# Patient Record
Sex: Male | Born: 1990 | Race: Black or African American | Hispanic: No | Marital: Single | Smoking: Never smoker
Health system: Southern US, Community
[De-identification: ages and names within clinical notes are randomized; demographics above are authoritative.]

## PROBLEM LIST (undated history)

## (undated) DIAGNOSIS — J45909 Unspecified asthma, uncomplicated: Secondary | ICD-10-CM

## (undated) HISTORY — PX: SPINE SURGERY: SHX786

---
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
## 2009-03-05 ENCOUNTER — Ambulatory Visit: Payer: Self-pay | Admitting: Thoracic Surgery

## 2010-06-21 ENCOUNTER — Encounter: Payer: Self-pay | Admitting: Thoracic Surgery

## 2010-06-22 ENCOUNTER — Encounter: Payer: Self-pay | Admitting: Thoracic Surgery

## 2010-06-28 IMAGING — CR DG CHEST 1V PORT
1 series · 1 of 1 positions shown · non-contrast
Comparison: Chest radiographs obtained earlier today and chest CT
dated 01/28/2008.

CLINICAL DATA: Status post surgery for a right paraspinal mass.

PORTABLE CHEST - 1 VIEW

[view not recorded]
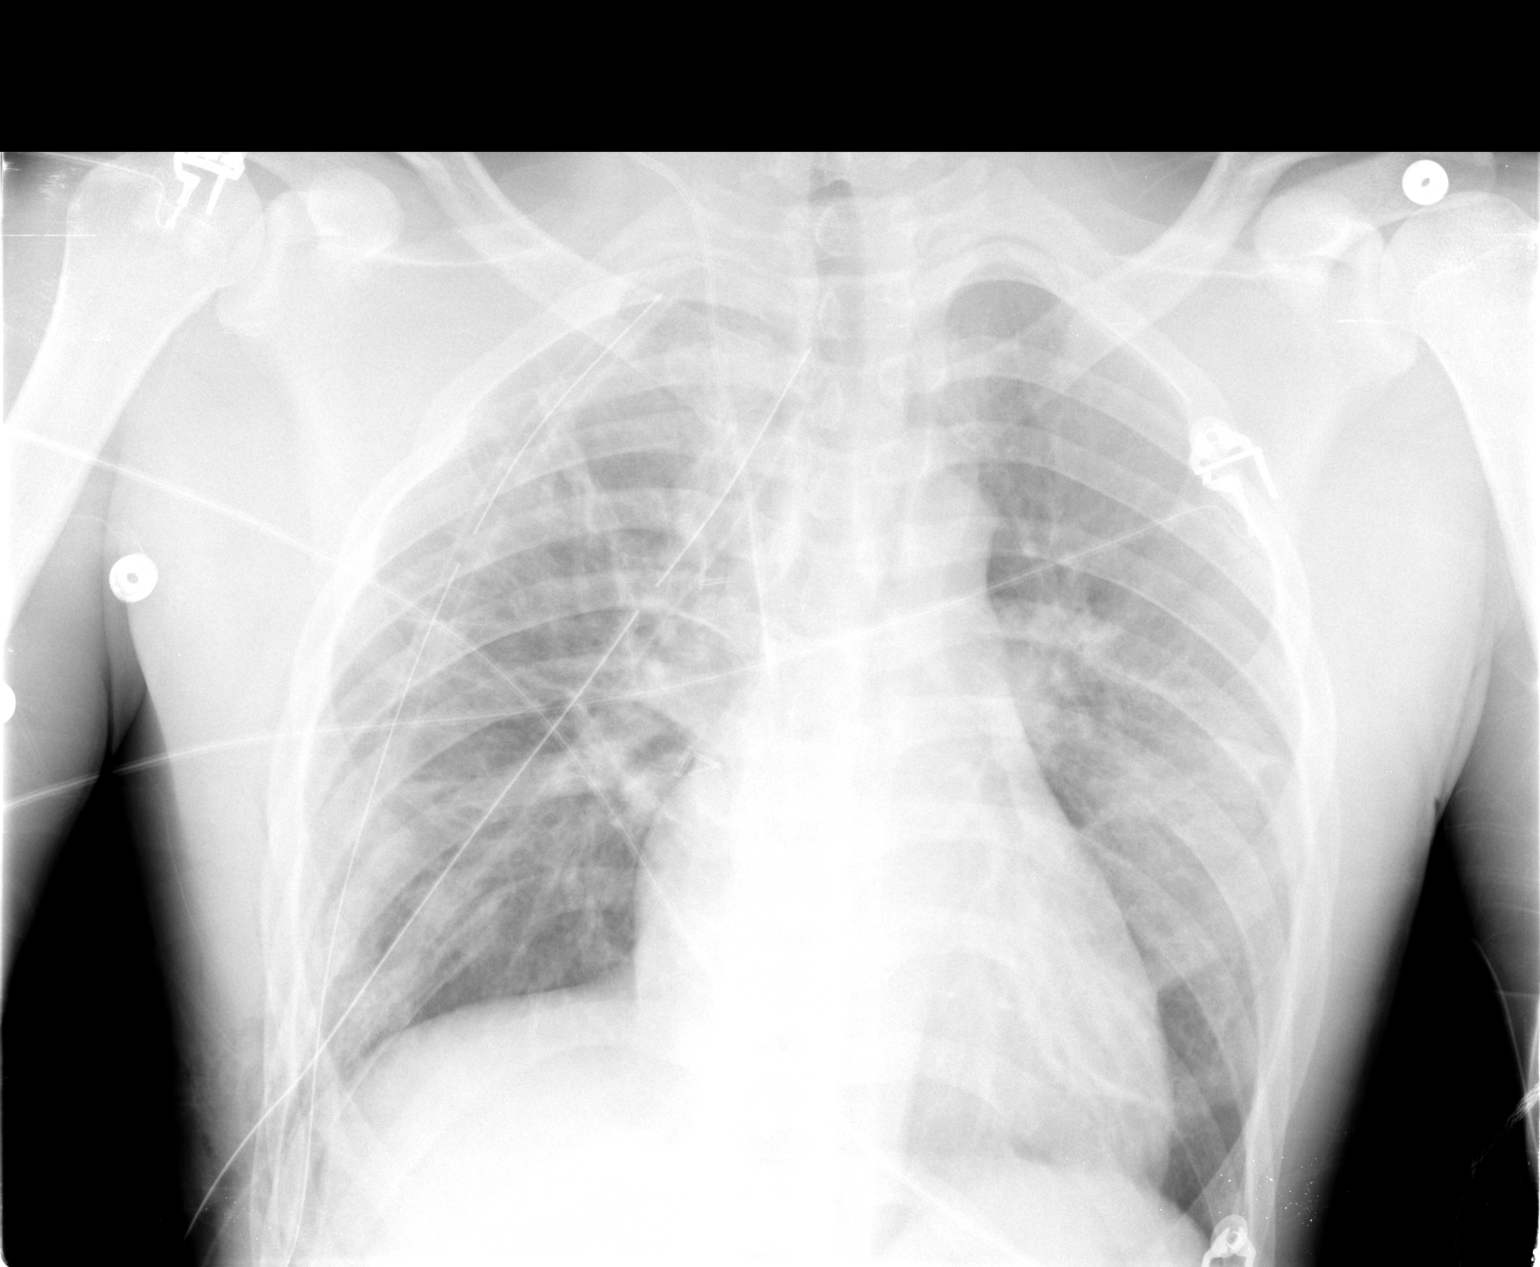

[1 of 1 positions shown; findings below may reference images not displayed]

FINDINGS: The right paraspinal mass has been resected with
associated surgical clips.  Interval right jugular catheter with
its tip in the superior vena cava.  Two right chest tubes are in
place.  No pneumothorax is seen.  The heart remains grossly normal
in size.  Mild bilateral atelectasis.  Small amount of subcutaneous
emphysema on the right.
IMPRESSION: 1.  Postsurgical changes on the right without pneumothorax.
2.  Mild bilateral atelectasis.

## 2010-06-29 IMAGING — CR DG CHEST 1V PORT
1 series · 1 of 1 positions shown · non-contrast
Comparison: 04/29/2008

CLINICAL DATA: Postop.

PORTABLE CHEST - 1 VIEW , portable at 5997 hours:

[view not recorded]
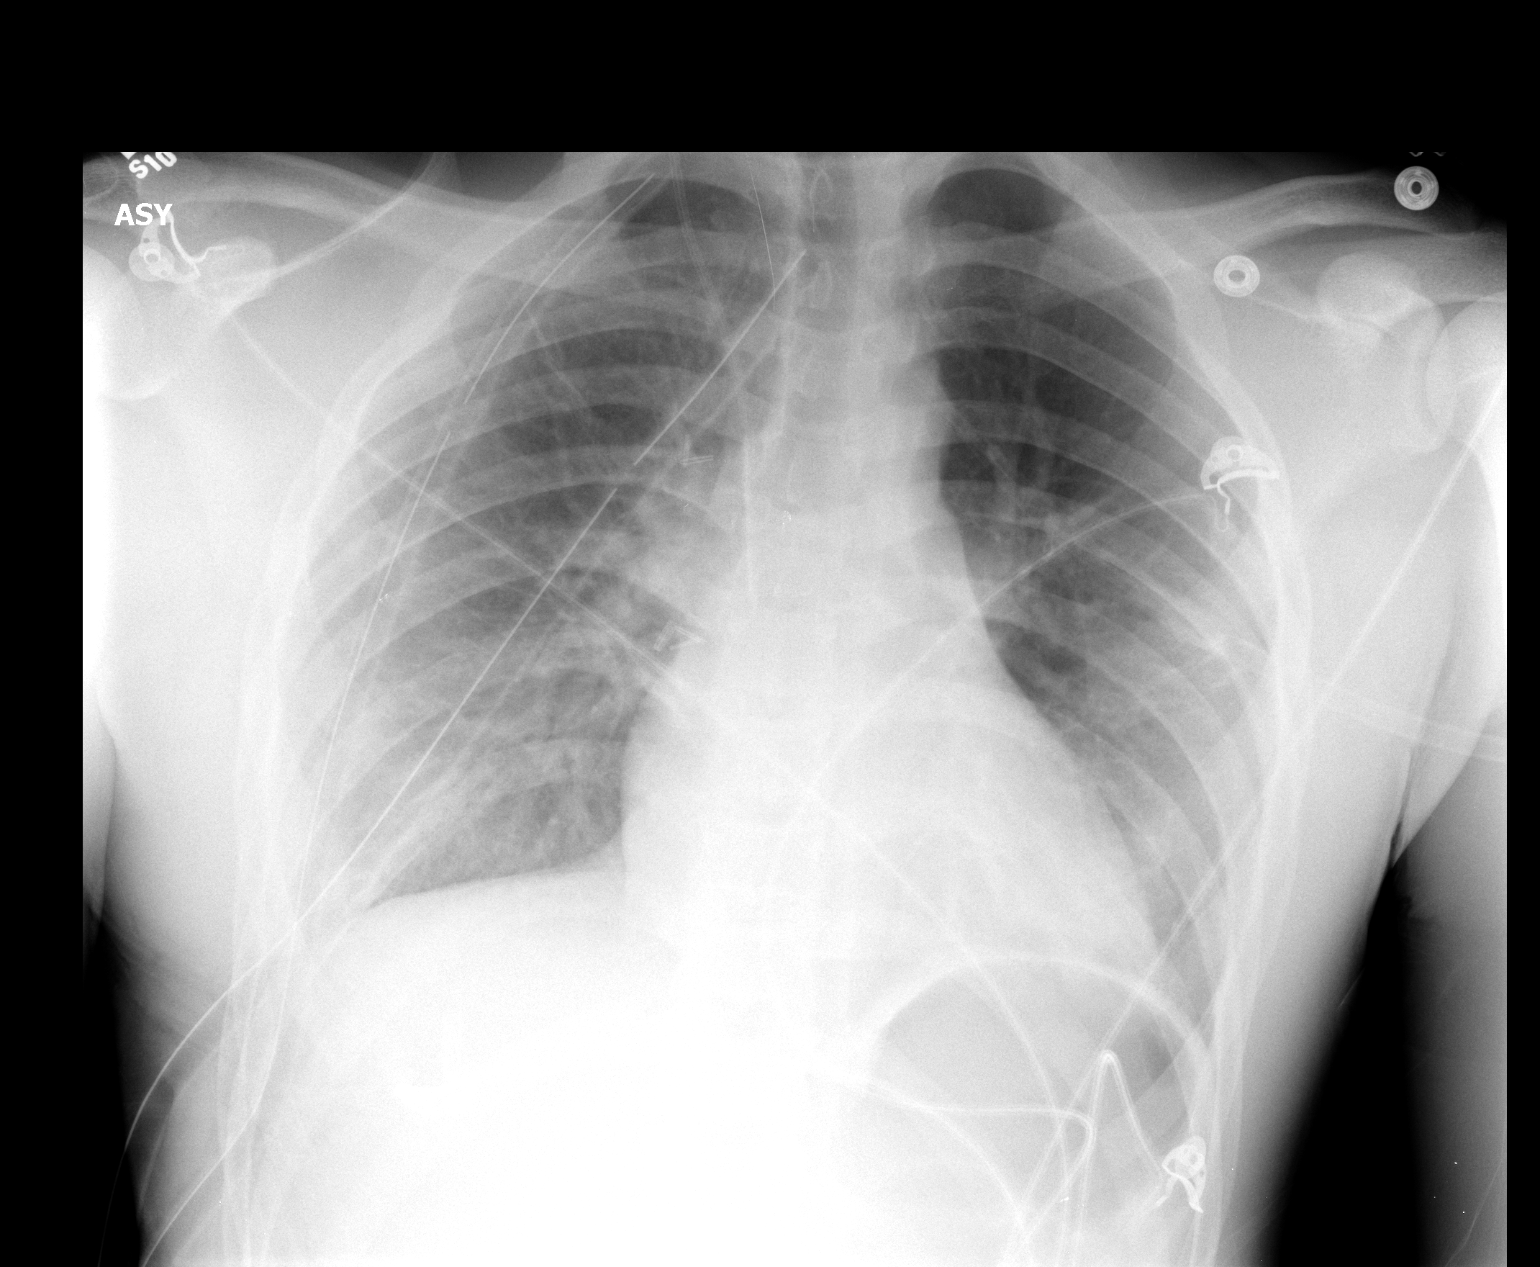

[1 of 1 positions shown; findings below may reference images not displayed]

FINDINGS: Two right pleural chest tubes in position with tips at
the apex of the right hemithorax.  Right jugular CVC is in the
lower SVC.  Lungs well expanded and clear of an active process. No
pneumothorax
IMPRESSION: No acute chest findings.

## 2010-10-13 NOTE — H&P (Signed)
NAME:  SAJAN, CHEATWOOD NO.:  [id number]   MEDICAL RECORD NO.:  [id number]          PATIENT TYPE:  INP   LOCATION:  NA                           FACILITY:  [facility]   PHYSICIAN:  Ines Bloomer, M.D. DATE OF BIRTH:  May 14, 1991   DATE OF ADMISSION:  DATE OF DISCHARGE:                              HISTORY & PHYSICAL   CHIEF COMPLAINT:  Right lung mass.   HISTORY OF PRESENT ILLNESS:  This 20 year old African American male has  a history of abscess and went to the emergency room where a chest x-ray  showed an abnormal posterior mediastinal mass.  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  At T6, it does enter the  intervertebral foramen but causes no cord compression.  An MRI was done  which essentially showed the same things as CT scan.  He has had no  symptoms.  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  He has no fevers, weight loss, chills, or hemoptysis.   PAST MEDICAL HISTORY:  Normal childhood illnesses.   MEDICATIONS:  Albuterol inhaler, Flonase, and Clarinex-D.   ALLERGIES:  He has no medication allergies.   FAMILY HISTORY:  Noncontributory.   SOCIAL HISTORY:  He is senior at [employer], runs track.  No admitted  alcohol or tobacco abuse.   REVIEW OF SYSTEMS:  VITAL SIGNS:  He is 5 feet 8 inches.  CARDIAC:  No  angina or atrial fibrillation.  PULMONARY:  No asthma.  No hemoptysis.  GU:  No kidney disease.  GI:  No nausea, vomiting, constipation, or  diarrhea.  VASCULAR:  No claudication, DVT, or TIAs.  NEUROLOGIC:  No  dizziness, headaches, blackouts, or seizures.  No symptoms of cord  compression.  PSYCHIATRIC:  No depression or nervousness.  ENT:  No  changes in eyesight or hearing.  HEMATOLOGICAL:  No problems with  bleeding, clotting disorders or pain.   PHYSICAL EXAMINATION:  GENERAL:  He is a well-developed [place] American  male in no acute distress.  VITAL SIGNS:  His blood pressure is 134/60, pulse 56, respirations 18,  and sats were 96%.  HEAD:  Atraumatic.  EYES:  Pupils equal and reactive to light and accommodation.  Extraocular movements are normal.  EARS:  Tympanic membranes are intact.  NOSE:  There is no septal deviation.  THROAT:  Without lesion.  NECK:  Supple without thyromegaly.  There is no supraclavicular or  axillary adenopathy.  HEART:  Regular sinus rhythm, no murmurs.  ABDOMEN:  Soft.  There is no hepatosplenomegaly.  EXTREMITIES:  Pulses are 2+.  There is no clubbing or edema.  NEUROLOGICAL:  He is oriented x3.  Sensory and motor intact.  Cranial  nerves intact.  SKIN:  Without lesion.   IMPRESSION:  1. Right T5 through T7 posterior mediastinal mass, rule out      paraganglioma, rule out schwannoma.  2. Asthma.   PLAN:  With Dr. Donalee Citrin, right VATS, right thoracotomy,  and resection  of posterior mediastinal mass.      Ines Bloomer, M.D.  Electronically Signed     DPB/MEDQ  D:  04/23/2008  T:  04/24/2008  Job:  [id number]

## 2010-10-13 NOTE — Op Note (Signed)
NAMEMarland Kitchen  AUBRY, TUCHOLSKI NO.:  [id number]   MEDICAL RECORD NO.:  [id number]          PATIENT TYPE:  INP   LOCATION:  3113                         FACILITY:  [facility]   PHYSICIAN:  Donalee Citrin, M.D.        DATE OF BIRTH:  [birth year]   DATE OF PROCEDURE:  DATE OF DISCHARGE:                               OPERATIVE REPORT   PREOPERATIVE DIAGNOSIS:  Right paraspinal mass.   PROCEDURE:  Right paraspinal mass.   PROCEDURE:  Transthoracic removal of right paraspinal tumor at T6.   SURGEON:  Donalee Citrin, MD   CO-SURGEON:  Ines Bloomer, MD   ANESTHESIA:  General endotracheal.   HISTORY OF PRESENT ILLNESS:  The patient is an 20 year old gentleman who  was noted to have a right paraspinal mass picked up initially on chest x-  ray and subsequent chest CT and MRI scan consistent with either  schwannoma or paraganglioma.  This mass is measuring about 3 x 6 cm and  was located centering around the T6 vetebral body.  The patient is  recommended transthoracic approach to the mass.  The first part of the  operative note, both the exposure and closure and initiation of  resection tumor will be dictated by Dr. Ines Bloomer.  This is the  intrathoracic resection of the mass.   OPERATIVE PROCEDURE:  After the thoracotomy performed by Dr. Edwyna Shell and  the mass was identified, the lateral medial margins of the mass were  identified along the rib using a careful blunt dissection after the  lateral pleura had been divided and a capsule of mass was identified.  Using blunt dissection, using Kittners and then Metzenbaum scissors, we  developed a plane working both laterally above the inferior rib  developing the plane working medially along the lateral vertebral body.  The plane around the lateral vertebral body was then also developed.  The mass was very well encapsulated and sequentially divided and removed  from the lateral vertebral body as well as superior aspect of the rib as  there was what appeared to be 2 possible points where there were tails  that extended into the intervertebral foramen.  The inferior vertebral  plane was easily developed on the predominant what was probably the T6  vertebral foramen.  The mass did have a small tail that tracked along  the nerve root, so attention was taken to try to develop the plane  around this mass and identified this intercostal and the intercostal  nerve was then identified and divided as the mass was originating from  around that nerve and was necessitated for removal of the mass, but  working sequentially inferomedially as well as superolaterally planes  were developed and working around the capsule being careful not enter  into the tumor itself.  This was developed until the only piece of the  pedicle attachment was into the T6 vertebral foramen.  After this nerve  was divided and the tumor was removed from the chest cavity, the foramen  was explored and there was no overt residual tumor remaining although it  is possible that some could retract along the foramen into the canal;  however, preoperative CT and MRI did not confirm any intraforaminal or  intracanalicular extension of the tumor mass.  Careful inspection and  careful  hemostasis was then obtained.  No additional tumor mass was appreciated.  The wound was then turned over to Dr. Edwyna Shell for closure, which will be  dictated in a separate operative note.  At the end of the case, all  needle count and sponge count correct.           ______________________________  Donalee Citrin, M.D.     GC/MEDQ  D:  04/29/2008  T:  04/29/2008  Job:  [id number]

## 2010-10-13 NOTE — Letter (Signed)
May 08, 2008   Donalee Citrin, MD  [address]  [place], [place] [address]   Re:  LATHYN, GRIGGS                  DOB:  Sep 30, 1990   Dear Jillyn Hidden:   I saw the patient back in the office today, and he is doing remarkably  well after his thoracotomy for resection of the ganglioneuroma.  His  chest x-ray showed postoperative changes.  You can see clips where the  neuroma was.  His lungs were clear to auscultation and percussion.  We  removed his chest tube sutures and told him to gradually increase his  activities.  I will see him back again in 2 weeks and hope we can  release him to full activity at that time.   Ines Bloomer, M.D.  Electronically Signed   DPB/MEDQ  D:  05/08/2008  T:  05/09/2008  Job:  [id number]

## 2010-10-13 NOTE — Assessment & Plan Note (Signed)
OFFICE VISIT   John Holloway, John Holloway  DOB:  02-01-1991                                        May 21, 2008  CHART #:  [id number]   The patient comes back in the office today for a 2-week followup.  He is  doing well status post a right thoracotomy for resection of  ganglioneuroma.  He is still having some soreness, but is overall doing  very well.  He has no specific complaints today other than just some  soreness and some mild numbness around the incision site.   PHYSICAL EXAMINATION:  VITAL SIGNS:  Blood pressure is 122/68, pulse is  72, respirations 18, and O2 98% on room air.  CHEST:  His right thoracotomy incision and chest tube sites have healed  well.  HEART:  Regular rate and rhythm without murmurs, rubs, or gallops.  LUNGS:  Clear to auscultation.   Chest x-ray shows stable postoperative changes and no pneumothorax.   ASSESSMENT AND PLAN:  The patient is doing well postoperatively.  Dr.  Edwyna Shell saw him today and reviewed his chest x-ray.  We will plan on  allowing him to return to school on June 03, 2008, and back to track  practice on June 14, 2008, and Dr. Edwyna Shell has written him a note to  this effect.  He will get in to see Dr. Wynetta Emery in the next 1-2 weeks.  We  will plan on seeing him back here again in 6 weeks with a chest x-ray  for follow up.   Ines Bloomer, M.D.  Electronically Signed   GC/MEDQ  D:  05/21/2008  T:  05/21/2008  Job:  [id number]   cc:   Donalee Citrin, M.D.

## 2010-10-13 NOTE — Op Note (Signed)
NAMEMarland Holloway  John, Holloway NO.:  [id number]   MEDICAL RECORD NO.:  [id number]          PATIENT TYPE:  INP   LOCATION:  3113                         FACILITY:  [facility]   PHYSICIAN:  Ines Bloomer, M.D. DATE OF BIRTH:  10/09/1990   DATE OF PROCEDURE:  04/29/2008  DATE OF DISCHARGE:                               OPERATIVE REPORT   PREOPERATIVE DIAGNOSIS:  Right paraspinal mass.   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   OPERATION PERFORMED:  Right thoracotomy resection of T5-T6 neurogenic  tumor.   SURGEON:  Ines Bloomer, MD   CO-SURGEON:  Donalee Citrin, MD   FIRST ASSISTANT:  Coral Ceo, PA-C   After percutaneous insertion of all monitor lines, the patient underwent  general anesthesia, was turned to the right lateral thoracotomy  position, dual-lumen tube was inserted.  The right lung was deflated.  This patient had a large 4 x 6 tumor over T5 and T6.  This was felt to  be possibly a paraganglioma or neurogenic tumor.  The right lung was  deflated, and two trocar sites were made in the anterior and posterior  axillary line at the seventh intercostal space.  Two trocars were  inserted at 0 degree.  A third-degree scope was inserted, and the lesion  was easily seen at T5-C6.  The pictures were taken of this.  Then, a  right posterolateral thoracotomy was made.  It was carried down with  electrocautery through the subcutaneous tissue and fascia.  Pretracheal  fascia was entered, and it was carried down to the subcutaneous tissue.  The latissimus was partially divided.  The serratus was reflected  anteriorly.  We first entered the sixth intercostal space, and I had  realized we would also have to go in the fifth intercostal space in  order to get a complete resection of the upper portion.  The dissection  was started.  __________  retractor was placed in the intercostal space.  Dissection was started.  Inferiorly dissecting it off the portion of  the  sixth and seventh rib and then dissected with electrocautery and then  dissecting up to the sixth rib and then finally the fifth rib, we were  able to dissect it out of the sixth intercostal space without too much  trouble, and Dr. Wynetta Emery will dictate this in more detail separately.  Superiorly, we divided the pleura over the spinal cord and reflected  medially to laterally, and then, superiorly we divided the pleura,  dissected from superiorly to inferiorly.  The largest problem was at the  fifth intercostal space, which we dissected off the fourth and fifth and  sixth rib dissecting down to the intervertebral foramen and then  dividing the tumor out of the intervertebral foramen.  Bleeding was  electrocoagulated.  The tumor was removed and mass and sent for frozen  section which revealed a neurogenic tumor.  All bleeding was  electrocoagulated.  Two chest tubes were brought into the trocar sites  and tied in place with 0 silk.  Chest was closed with six pericostals  drilling through the fifth  and the sixth ribs and passed around the  fourth rib and #1 Vicryl in the  muscle layer, 2-0 Vicryl in the subcutaneous tissue, and Dermabond for  the skin.  Single On-Q was done in the usual fashion.  Marcaine block  was done in the usual fashion.  The patient was returned to recovery  room in stable condition.      Ines Bloomer, M.D.  Electronically Signed     DPB/MEDQ  D:  04/29/2008  T:  04/29/2008  Job:  [id number]   cc:   Donalee Citrin, M.D.

## 2010-10-13 NOTE — Discharge Summary (Signed)
NAME:  John Holloway, John Holloway NO.:  [id number]   MEDICAL RECORD NO.:  [id number]          PATIENT TYPE:  INP   LOCATION:  2008                         FACILITY:  [facility]   PHYSICIAN:  Donalee Citrin, M.D.        DATE OF BIRTH:  [birth year]   DATE OF ADMISSION:  04/29/2008  DATE OF DISCHARGE:  05/03/2008                               DISCHARGE SUMMARY   ADMITTING DIAGNOSIS:  Paraganglioma, T6.   HISTORY OF PRESENT ILLNESS:  The patient is an 20 year old gentleman who  was admitted to the hospital, underwent a transthoracic resection of the  paraganglioma at T6.  Postoperatively, the patient did very well right  through the ICU with chest tube placed, was neurologically intact,  stable, progressing, mobilized once the tube was able to be taken out  and pain came under very good control.  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  The patient was doing very well, stable  with no postoperative __________ chest tube.  The patient was discharged  home on postop day #5.  After discharge, the patient was voiding  spontaneously, tolerating p.o. pain medications __________.           ______________________________  Donalee Citrin, M.D.     GC/MEDQ  D:  06/26/2008  T:  06/27/2008  Job:  [id number]

## 2010-10-13 NOTE — Letter (Signed)
March 12, 2008   Donalee Citrin, MD  [address], [address]  [place], [place] [address]   Re:  John Holloway, BATTISTE                  DOB:  03-08-1991   Dear Jillyn Hidden,   I saw the patient today.  This patient has a history of asthma and went  to the emergency room, got a chest x-ray that showed an abnormal  posterior mediastinal mass.  A CT scan done and the CT scan showed a T5,  6, and 7 posterior mediastinal mass, which was possibly a paraganglioma  or schwannoma.  The T6 does enter the intervertebral foramen.  MRI was  also done to confirm this.  He has had no symptoms.  He has been an  active teenager runs track and has been considered for college  scholarship.  He has had no sweating, fever, or chills.  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   PAST MEDICAL HISTORY:  He has had normal childhood diseases.   MEDICATIONS:  He is on albuterol inhaler, Flonase, Claritin-D, and  Nasonex.   ALLERGIES:  He has no allergies.   FAMILY HISTORY:  Noncontributory.   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  No alcohol or smoking abuse.   REVIEW OF SYSTEMS:  Vital Signs:  He is 5 feet 8 inches.  Cardiac:  Has  no symptoms.  Pulmonary:  Except for asthma is negative.  No problems  with GI, GU, vascular, neurological, psychiatric, ENT, or hematological  problems.   PHYSICAL EXAMINATION:  GENERAL:  He is a well-developed [place]  male, in no acute distress.  VITAL SIGNS:  Blood pressure is 134/65, pulse 56, respirations 18, and  sats were 96%.  HEAD, EYES, EARS, NOSE, AND THROAT:  Unremarkable.  NECK:  Supple without thyromegaly.  There is no supraclavicular or  axillary adenopathy.  CHEST:  Clear to auscultation and percussion.  HEART:  Regular sinus rhythm.  No murmurs.  ABDOMEN:  Soft.  There is no hepatosplenomegaly.  EXTREMITIES:  Pulses are 2+.  There is no clubbing or edema.  NEUROLOGIC:   She is oriented x3.  Sensory and motor intact.  Cranial  nerves intact.   I feel that this is a fairly large posterior mediastinal mass and I  think a paraganglioma or schwannoma would be most likely diagnosis with  this involving an intervertebral foramina,  I think, he will probably  require open thoracotomy resected also given the size.  I discussed in  great detail the complications of the possible surgery including  infection, bleeding, and possible spinal cord trauma.  I think, we  should probably proceed with this earlier rather than later.  He did  want to put this off to next June because of his track, but I am so much  concerned given the size of this that he may develop problems if we put  it off that long.  I will discuss this with you and then probably make  arrangements to proceed.   Ines Bloomer, M.D.  Electronically Signed   DPB/MEDQ  D:  03/12/2008  T:  03/13/2008  Job:  [id number]   cc:   Deatra James, M.D.

## 2010-10-13 NOTE — Assessment & Plan Note (Signed)
OFFICE VISIT   ALMUS, WOODHAM  DOB:  Dec 26, 1990                                        July 09, 2008  CHART #:  [id number]   HISTORY OF PRESENTING ILLNESS:  This is an 20 year old African American  male who is status post right thoracotomy with resection of  ganglioneuroma on 04/29/2008.  He was last seen in the office on  05/21/2008 at which time, he was complaining of some soreness and some  mild numbness around the incision site.  Currently today, however, his  soreness is completely resolved and he denies any further numbness or  shortness of breath.   PHYSICAL EXAMINATION:  GENERAL:  This is an 20 year old [place] American  male who is in no acute distress who is alert, oriented, and  cooperative.  VITAL SIGNS:  Latest vital signs reveal blood pressure  138/72, pulse rate 50, respirations 18, and O2 sat 97% on room air.  CARDIOVASCULAR:  Regular rate and rhythm.  No murmurs, gallops, or rubs.  PULMONARY:  Clear to auscultation bilaterally.  No rales, wheezes, or  rhonchi.  WOUNDS:  Right chest wound is well healed.   Chest x-ray done today shows no pneumothorax and healing left rib  fracture, otherwise no abnormalities noted.   IMPRESSION AND PLAN:  The patient is surgically stable, status post  right thoracotomy with resection of ganglioneuroma.  He has been cleared  to do all activities.  He will return to see Dr. Edwyna Shell in 6 months with  a chest x-ray and after that he should have a yearly chest x-ray for  serial monitoring.   Ines Bloomer, M.D.  Electronically Signed   DZ/MEDQ  D:  07/09/2008  T:  07/09/2008  Job:  [id number]   cc:   Donalee Citrin, M.D.

## 2011-03-02 LAB — TYPE AND SCREEN

## 2011-03-02 LAB — COMPREHENSIVE METABOLIC PANEL
AST: 28 U/L (ref 0–37)
Alkaline Phosphatase: 75 U/L (ref 39–117)
BUN: 14 mg/dL (ref 6–23)
Chloride: 106 mEq/L (ref 96–112)
Glucose, Bld: 109 mg/dL — ABNORMAL HIGH (ref 70–99)
Potassium: 4.1 mEq/L (ref 3.5–5.1)
Sodium: 137 mEq/L (ref 135–145)
Total Bilirubin: 0.8 mg/dL (ref 0.3–1.2)

## 2011-03-02 LAB — BLOOD GAS, ARTERIAL
O2 Saturation: 97.8 %
Patient temperature: 98.6
pH, Arterial: 7.423 (ref 7.350–7.450)

## 2011-03-02 LAB — URINE MICROSCOPIC-ADD ON

## 2011-03-02 LAB — ABO/RH: ABO/RH(D): O POS

## 2011-03-02 LAB — CBC
MCV: 82.1 fL (ref 78.0–100.0)
WBC: 4.4 10*3/uL (ref 4.0–10.5)

## 2011-03-02 LAB — URINALYSIS, ROUTINE W REFLEX MICROSCOPIC
Bilirubin Urine: NEGATIVE
Hgb urine dipstick: NEGATIVE
Ketones, ur: 15 mg/dL — AB
Leukocytes, UA: NEGATIVE
Nitrite: NEGATIVE
Specific Gravity, Urine: 1.038 — ABNORMAL HIGH (ref 1.005–1.030)
Urobilinogen, UA: 1 mg/dL (ref 0.0–1.0)

## 2011-03-02 LAB — PROTIME-INR
INR: 1.1 (ref 0.00–1.49)
Prothrombin Time: 14.7 seconds (ref 11.6–15.2)

## 2011-03-02 LAB — APTT: aPTT: 35 seconds (ref 24–37)

## 2011-03-05 LAB — COMPREHENSIVE METABOLIC PANEL
AST: 47 U/L — ABNORMAL HIGH (ref 0–37)
Albumin: 3.4 g/dL — ABNORMAL LOW (ref 3.5–5.2)
BUN: 5 mg/dL — ABNORMAL LOW (ref 6–23)
Calcium: 9.1 mg/dL (ref 8.4–10.5)
Creatinine, Ser: 1.06 mg/dL (ref 0.4–1.5)
GFR calc Af Amer: >60 mL/min (ref >60)
Total Bilirubin: 0.7 mg/dL (ref 0.3–1.2)
Total Protein: 6.2 g/dL (ref 6.0–8.3)

## 2011-03-05 LAB — BASIC METABOLIC PANEL
BUN: 8 mg/dL (ref 6–23)
CO2: 29 mEq/L (ref 19–32)
Chloride: 100 mEq/L (ref 96–112)
Creatinine, Ser: 1.07 mg/dL (ref 0.4–1.5)
GFR calc Af Amer: >60 mL/min (ref >60)
Potassium: 4 mEq/L (ref 3.5–5.1)

## 2011-03-05 LAB — CBC
HCT: 40.7 % (ref 39.0–52.0)
HCT: 42.7 % (ref 39.0–52.0)
MCHC: 33.2 g/dL (ref 30.0–36.0)
MCHC: 33.3 g/dL (ref 30.0–36.0)
MCV: 82.1 fL (ref 78.0–100.0)
MCV: 82.8 fL (ref 78.0–100.0)
Platelets: 246 10*3/uL (ref 150–400)
RBC: 5.2 MIL/uL (ref 4.22–5.81)
RDW: 15.1 % (ref 11.5–15.5)
WBC: 10 10*3/uL (ref 4.0–10.5)
WBC: 8.5 10*3/uL (ref 4.0–10.5)

## 2011-10-16 ENCOUNTER — Emergency Department (HOSPITAL_COMMUNITY)
Admission: EM | Admit: 2011-10-16 | Discharge: 2011-10-16 | Disposition: A | Discharge status: Home / Self Care | Payer: Self-pay | Attending: Emergency Medicine | Admitting: Emergency Medicine

## 2011-10-16 ENCOUNTER — Encounter (HOSPITAL_COMMUNITY): Payer: Self-pay | Admitting: *Deleted

## 2011-10-16 DIAGNOSIS — R112 Nausea with vomiting, unspecified: Secondary | ICD-10-CM | POA: Insufficient documentation

## 2011-10-16 DIAGNOSIS — F10929 Alcohol use, unspecified with intoxication, unspecified: Secondary | ICD-10-CM

## 2011-10-16 DIAGNOSIS — F1010 Alcohol abuse, uncomplicated: Secondary | ICD-10-CM | POA: Insufficient documentation

## 2011-10-16 LAB — RAPID URINE DRUG SCREEN, HOSP PERFORMED
Amphetamines: NOT DETECTED
Barbiturates: NOT DETECTED
Benzodiazepines: NOT DETECTED
Cocaine: NOT DETECTED
Opiates: NOT DETECTED
Tetrahydrocannabinol: NOT DETECTED

## 2011-10-16 LAB — POCT I-STAT, CHEM 8
Calcium, Ion: 1.15 mmol/L (ref 1.12–1.32)
Chloride: 106 mEq/L (ref 96–112)
Glucose, Bld: 110 mg/dL — ABNORMAL HIGH (ref 70–99)
HCT: 45 % (ref 39.0–52.0)
TCO2: 24 mmol/L (ref 0–100)

## 2011-10-16 MED ORDER — SODIUM CHLORIDE 0.9 % IV BOLUS (SEPSIS)
1000.0000 mL | Freq: Once | INTRAVENOUS | Status: AC
  Administered 2011-10-16: 1000 mL via INTRAVENOUS

## 2011-10-16 MED ORDER — ONDANSETRON HCL 4 MG/2ML IJ SOLN
4.0000 mg | Freq: Once | INTRAMUSCULAR | Status: AC
  Administered 2011-10-16: 4 mg via INTRAVENOUS

## 2011-10-16 MED ORDER — PANTOPRAZOLE SODIUM 40 MG IV SOLR
40.0000 mg | Freq: Once | INTRAVENOUS | Status: AC
  Administered 2011-10-16: 40 mg via INTRAVENOUS
  Filled 2011-10-16: qty 40

## 2011-10-16 MED ORDER — SODIUM CHLORIDE 0.9 % IV SOLN: Freq: Once | INTRAVENOUS | Status: AC

## 2011-10-16 MED ORDER — ONDANSETRON HCL 4 MG/2ML IJ SOLN
4.0000 mg | Freq: Once | INTRAMUSCULAR | Status: AC
  Filled 2011-10-16: qty 2

## 2011-10-16 NOTE — ED Notes (Signed)
Pt vomiting x 1 hour, has been drinking this evening, "passed out" a couple times.  Alert and oriented at this time.

## 2011-10-16 NOTE — ED Provider Notes (Signed)
History     CSN: [id number]  Arrival date & time 10/16/11  0430   First MD Initiated Contact with Patient 10/16/11 [phone]      Chief Complaint  Patient presents with  . Emesis    (Consider location/radiation/quality/duration/timing/severity/associated sxs/prior treatment) HPI Comments: Possible alcohol poisoning.  Out drinking tonight, blacked out several times and vomiting.  Patient is a 21 y.o. male presenting with vomiting. The history is provided by the patient.  Emesis  This is a new problem. The current episode started 1 to 2 hours ago. The problem has not changed since onset.The emesis has an appearance of stomach contents.    History reviewed. No pertinent past medical history.  Past Surgical History  Procedure Date  . Spine surgery     History reviewed. No pertinent family history.  History  Substance Use Topics  . Smoking status: Never Smoker   . Smokeless tobacco: Not on file  . Alcohol Use: Yes      Review of Systems  Gastrointestinal: Positive for vomiting.  All other systems reviewed and are negative.    Allergies  Review of patient's allergies indicates no known allergies.  Home Medications   Current Outpatient Rx  Name Route Sig Dispense Refill  . FEXOFENADINE HCL 180 MG PO TABS Oral Take 180 mg by mouth daily.      BP 133/79  Pulse 71  Temp(Src) 98.3 F (36.8 C) (Oral)  Resp 16  SpO2 99%  Physical Exam  Nursing note and vitals reviewed. Constitutional: He appears well-developed and well-nourished.       Odor of alcohol present.  HENT:  Head: Normocephalic and atraumatic.  Eyes: EOM are normal. Pupils are equal, round, and reactive to light.       Scleras injected bilaterally.  Neck: Normal range of motion. Neck supple.  Cardiovascular: Normal rate and regular rhythm.   Pulmonary/Chest: Effort normal and breath sounds normal. No respiratory distress.  Abdominal: Soft. Bowel sounds are normal. He exhibits no distension.    Musculoskeletal: Normal range of motion. He exhibits no edema.  Neurological:       Somnolent, but arousable.  Skin: Skin is warm and dry.    ED Course  Procedures (including critical care time)   Labs Reviewed  ETHANOL  URINE RAPID DRUG SCREEN (HOSP PERFORMED)   No results found.   No diagnosis found.    MDM  The patient arrived after ingesting a significant quantity of alcohol.  He is somnolent, but is arousable and appropriate.  The labs show an alcohol level of 179 and the toxicology screen is pending.  He did have several episodes of retching that produced a small quantity of bloody-appearing material.  His abdomen is benign and labs look okay.  At this point, the care will be signed out to Dr. Effie Shy.  I anticipated the patient will be discharged once his alcohol level has decreased.          Geoffery Lyons, MD 10/16/11 [phone]

## 2011-10-16 NOTE — ED Notes (Signed)
Informed patient that we need a urine sample; gave him a urinal.

## 2011-10-16 NOTE — ED Provider Notes (Signed)
Nursing asked me to reevaluate the patient to see if he could go home.  Patient was sleepy, but aroused easily. He is oriented to person, place, and time. He has family members with him, who will take him to his home. The patient lives in his home with his sister. She is currently at home. The family members with him, are sober and will watch over him until he gets to his  Home with his sister. Repeat vitals are normal.  John Melter, MD 10/16/11 [phone]

## 2011-10-16 NOTE — Discharge Instructions (Signed)
Avoid heavy alcohol ingestion.  Alcohol Intoxication Alcohol intoxication means your blood alcohol level is above legal limits. Alcohol is a drug. It has serious side effects. These side effects can include:  Damage to your organs (liver, nervous system, and blood system).   Unclear thinking.   Slowed reflexes.   Decreased muscle coordination.  HOME CARE  Do not drink and drive.   Do not drink alcohol if you are taking medicine or using other drugs. Doing so can cause serious medical problems or even death.   Drink enough water and fluids to keep your pee (urine) clear or pale yellow.   Eat healthy foods.   Only take medicine as told by your doctor.   Join an alcohol support group.  GET HELP RIGHT AWAY IF:  You become shaky when you stop drinking.   Your thinking is unclear or you become confused.   You throw up (vomit) blood. It may look bright red or like coffee grounds.   You notice blood in your poop (bowel movements).   You become lightheaded or pass out (faint).  MAKE SURE YOU:   Understand these instructions.   Will watch your condition.   Will get help right away if you are not doing well or get worse.  Document Released: 11/03/2007 Document Revised: 05/06/2011 Document Reviewed: 11/03/2009 [facility] Patient Information 2012 [place], [place].

## 2012-11-20 ENCOUNTER — Encounter (HOSPITAL_COMMUNITY): Payer: Self-pay | Admitting: Emergency Medicine

## 2012-11-20 ENCOUNTER — Emergency Department (HOSPITAL_COMMUNITY)
Admission: EM | Admit: 2012-11-20 | Discharge: 2012-11-21 | Disposition: A | Discharge status: Home / Self Care | Payer: Self-pay | Attending: Emergency Medicine | Admitting: Emergency Medicine

## 2012-11-20 ENCOUNTER — Emergency Department (HOSPITAL_COMMUNITY): Payer: Self-pay

## 2012-11-20 DIAGNOSIS — T148XXA Other injury of unspecified body region, initial encounter: Secondary | ICD-10-CM

## 2012-11-20 DIAGNOSIS — X503XXA Overexertion from repetitive movements, initial encounter: Secondary | ICD-10-CM | POA: Insufficient documentation

## 2012-11-20 DIAGNOSIS — Y929 Unspecified place or not applicable: Secondary | ICD-10-CM | POA: Insufficient documentation

## 2012-11-20 DIAGNOSIS — Y990 Civilian activity done for income or pay: Secondary | ICD-10-CM | POA: Insufficient documentation

## 2012-11-20 DIAGNOSIS — S239XXA Sprain of unspecified parts of thorax, initial encounter: Secondary | ICD-10-CM | POA: Insufficient documentation

## 2012-11-20 MED ORDER — IBUPROFEN 400 MG PO TABS
800.0000 mg | ORAL_TABLET | Freq: Once | ORAL | Status: AC
  Administered 2012-11-20: 800 mg via ORAL
  Filled 2012-11-20: qty 2

## 2012-11-20 MED ORDER — IBUPROFEN 800 MG PO TABS: 800.0000 mg | ORAL_TABLET | Freq: Once | ORAL | Status: DC

## 2012-11-20 MED ORDER — CYCLOBENZAPRINE HCL 5 MG PO TABS: 5.0000 mg | ORAL_TABLET | Freq: Three times a day (TID) | ORAL | Status: DC | PRN

## 2012-11-20 MED ORDER — CYCLOBENZAPRINE HCL 10 MG PO TABS
5.0000 mg | ORAL_TABLET | Freq: Once | ORAL | Status: AC
  Administered 2012-11-20: 5 mg via ORAL
  Filled 2012-11-20: qty 2

## 2012-11-20 NOTE — ED Provider Notes (Signed)
History    CSN: [id number] Arrival date & time 11/20/12  2113  First MD Initiated Contact with Patient 11/20/12 2316     Chief Complaint  Patient presents with  . 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  Was somewhat better after 2 days off but increased today while at work denies trauma HAs not taken any OTC meds nor tried heat/ice to the area   Patient is a 22 y.o. male presenting with back pain. The history is provided by the patient.  Back Pain Location:  Thoracic spine Quality:  Aching Radiates to:  Does not radiate Pain severity:  Moderate Pain is:  Unable to specify Duration:  4 days Timing:  Intermittent Progression:  Unchanged Chronicity:  New Context: lifting heavy objects   Relieved by:  Being still Worsened by:  Palpation, twisting and movement Ineffective treatments:  None tried Associated symptoms: no fever, no numbness and no weakness   Risk factors: no lack of exercise, no recent surgery and no steroid use    History reviewed. No pertinent past medical history. Past Surgical History  Procedure Laterality Date  . Spine surgery     No family history on file. History  Substance Use Topics  . Smoking status: Never Smoker   . Smokeless tobacco: Not on file  . Alcohol Use: Yes    Review of Systems  Constitutional: Negative for fever and chills.  Respiratory: Negative for cough and shortness of breath.   Musculoskeletal: Positive for back pain. Negative for myalgias.  Skin: Negative for rash and wound.  Neurological: Negative for weakness and numbness.  All other systems reviewed and are negative.    Allergies  Review of patient's allergies indicates no known allergies.  Home Medications   Current Outpatient Rx  Name  Route  Sig  Dispense  Refill  . Ibuprofen (ADVIL  PO)   Oral   Take 2 tablets by mouth every 6 (six) hours as needed (pain).         . cyclobenzaprine (FLEXERIL) 5 MG tablet   Oral   Take 1 tablet (5 mg total) by mouth 3 (three) times daily as needed for muscle spasms.   30 tablet   0   . ibuprofen (ADVIL,MOTRIN) 800 MG tablet   Oral   Take 1 tablet (800 mg total) by mouth once.   30 tablet   0    BP 135/58  Pulse 58  Temp(Src) 98.6 F (37 C) (Oral)  Resp 14  SpO2 100% Physical Exam  Nursing note and vitals reviewed. Constitutional: He is oriented to person, place, and time. He appears well-developed and well-nourished.  HENT:  Head: Normocephalic.  Eyes: Pupils are equal, round, and reactive to light.  Neck: Normal range of motion. Neck supple.  Cardiovascular: Normal rate and regular rhythm.   Pulmonary/Chest: Effort normal and breath sounds normal. He has no wheezes.  Musculoskeletal: Normal range of motion. He exhibits tenderness.       Back:  Neurological: He is alert and oriented to person, place, and time.  Skin: Skin is warm. No rash noted. No erythema.    ED Course  Procedures (including critical care time) Labs Reviewed - No data to display Dg Lumbar Spine Complete  11/20/2012   *RADIOLOGY REPORT*  Clinical Data: Low back pain  LUMBAR SPINE - COMPLETE 4+ VIEW  Comparison: None.  Findings:  Normal alignment and no fracture.  No significant disc degeneration.  No pars defect.  There is mild levoscoliosis.  IMPRESSION: No acute abnormality.   Original Report Authenticated By: Janeece Riggers, M.D.   1. Muscle strain     MDM   Muscle strain will treat with Ibuprofen and Flexeril  Arman Filter, NP 11/20/12 2339

## 2012-11-20 NOTE — ED Notes (Signed)
PT. 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 AMBULATORY.

## 2012-11-21 NOTE — ED Provider Notes (Signed)
Medical screening examination/treatment/procedure(s) were performed by non-physician practitioner and as supervising physician I was immediately available for consultation/collaboration.   Julie Manly, MD 11/21/12 0607 

## 2013-01-16 ENCOUNTER — Emergency Department (HOSPITAL_COMMUNITY)
Admission: EM | Admit: 2013-01-16 | Discharge: 2013-01-16 | Disposition: A | Discharge status: Home / Self Care | Payer: Self-pay | Attending: Emergency Medicine | Admitting: Emergency Medicine

## 2013-01-16 ENCOUNTER — Encounter (HOSPITAL_COMMUNITY): Payer: Self-pay | Admitting: Emergency Medicine

## 2013-01-16 DIAGNOSIS — R059 Cough, unspecified: Secondary | ICD-10-CM | POA: Insufficient documentation

## 2013-01-16 DIAGNOSIS — IMO0002 Reserved for concepts with insufficient information to code with codable children: Secondary | ICD-10-CM | POA: Insufficient documentation

## 2013-01-16 DIAGNOSIS — R05 Cough: Secondary | ICD-10-CM | POA: Insufficient documentation

## 2013-01-16 DIAGNOSIS — Y929 Unspecified place or not applicable: Secondary | ICD-10-CM | POA: Insufficient documentation

## 2013-01-16 DIAGNOSIS — Z9889 Other specified postprocedural states: Secondary | ICD-10-CM | POA: Insufficient documentation

## 2013-01-16 DIAGNOSIS — S29011A Strain of muscle and tendon of front wall of thorax, initial encounter: Secondary | ICD-10-CM

## 2013-01-16 DIAGNOSIS — S39012A Strain of muscle, fascia and tendon of lower back, initial encounter: Secondary | ICD-10-CM

## 2013-01-16 DIAGNOSIS — X500XXA Overexertion from strenuous movement or load, initial encounter: Secondary | ICD-10-CM | POA: Insufficient documentation

## 2013-01-16 DIAGNOSIS — Y9389 Activity, other specified: Secondary | ICD-10-CM | POA: Insufficient documentation

## 2013-01-16 MED ORDER — METHOCARBAMOL 500 MG PO TABS: 500.0000 mg | ORAL_TABLET | Freq: Two times a day (BID) | ORAL | Status: DC

## 2013-01-16 MED ORDER — IBUPROFEN 800 MG PO TABS: 800.0000 mg | ORAL_TABLET | Freq: Three times a day (TID) | ORAL | Status: DC | PRN

## 2013-01-16 NOTE — ED Provider Notes (Signed)
CSN: [id number]     Arrival date & time 01/16/13  2039 History  This chart was scribed for Antony Madura, PA working with Enid Skeens, MD by Quintella Reichert, ED Scribe. This patient was seen in room TR07C/TR07C and the patient's care was started at 9:30 PM.   Chief Complaint  Patient presents with  . Back Pain   The history is provided by the patient. No language interpreter was used.   HPI Comments: John Holloway is a 22 y.o. 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  Patient states that pain was initially sharp but it has since become tight and "hot."  He has attempted to treat pain with Advil, without relief.  He denies bowel or bladder incontinence, perianal numbness, difficulty walking, or numbness or tingling in legs.  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  He was following up for a year after the surgery but has not since then.  He denies any other residual complications of the surgery.  Pt also complains of chest tightness that began this morning.  In addition he notes that he has been coughing some but he attributed this to his allergies.  He denies SOB, fever, dizziness, nausea, vomiting, or any other associated symptoms.    History reviewed. No pertinent past medical history.  Past Surgical History  Procedure Laterality Date  . Spine surgery     No family history on file.  History  Substance Use Topics  . Smoking status: Never Smoker   . Smokeless tobacco: Not on file  . Alcohol Use: Yes    Review of Systems  Constitutional: Negative for fever.  Respiratory: Positive for cough. Negative for shortness of breath.   Cardiovascular: Positive for chest pain.  Gastrointestinal: Negative for nausea and vomiting.   Musculoskeletal: Positive for back pain.  All other systems reviewed and are negative.   Allergies  Review of patient's allergies indicates no known allergies.  Home Medications   Current Outpatient Rx  Name  Route  Sig  Dispense  Refill  . cyclobenzaprine (FLEXERIL) 5 MG tablet   Oral   Take 1 tablet (5 mg total) by mouth 3 (three) times daily as needed for muscle spasms.   30 tablet   0   . Ibuprofen (ADVIL PO)   Oral   Take 2 tablets by mouth every 6 (six) hours as needed (pain).         Marland Kitchen ibuprofen (ADVIL,MOTRIN) 800 MG tablet   Oral   Take 1 tablet (800 mg total) by mouth once.   30 tablet   0    BP 130/76  Pulse 70  Temp(Src) 98.3 F (36.8 C) (Oral)  Resp 14  SpO2 95%  Physical Exam  Nursing note and vitals reviewed. Constitutional: He is oriented to person, place, and time. He appears well-developed and well-nourished. No distress.  HENT:  Head: Normocephalic and atraumatic.  Eyes: Conjunctivae and EOM are normal. No scleral icterus.  Neck: Normal range of motion. Neck supple. No tracheal deviation present.  Cardiovascular: Normal rate, regular rhythm, normal heart sounds and intact distal pulses.   No murmur heard. Pulmonary/Chest: Effort normal and breath sounds normal. No respiratory distress. He has no wheezes. He has no rales. He exhibits tenderness.  Right-sided chest tenderness.  Musculoskeletal: Normal range of motion. He exhibits tenderness.       Cervical back: He exhibits no bony tenderness and no deformity.       Thoracic back: He exhibits tenderness and spasm. He exhibits no bony tenderness and no deformity.       Lumbar back: He exhibits no bony tenderness and no deformity.  Tenderness of the right and left thoracic paraspinal muscles, with spasm. No cervical, thoracic or lumbar bony tenderness. No bony deformities or step-offs palpated.  Neurological: He is alert and oriented to person, place, and time. He has normal reflexes.  No sensory  or motor deficits appreciated. DTRs normal and symmetric. Patient was extremities without ataxia.  Skin: Skin is warm and dry. No rash noted. No erythema. No pallor.  Psychiatric: He has a normal mood and affect. His behavior is normal.   ED Course  Procedures (including critical care time)  DIAGNOSTIC STUDIES: Oxygen Saturation is 95% on room air, adequate by my interpretation.    COORDINATION OF CARE: 9:43 PM-Informed pt that symptoms are most likely due to muscle strain.  Discussed treatment plan which includes symptomatic relief and f/u with orthopedics with pt at bedside and pt agreed to plan.   Labs Reviewed - No data to display  No results found.  1. Back strain, initial encounter   2. Chest wall muscle strain, initial encounter    MDM  Uncomplicated back strain and chest wall muscle strain; onset after moving apartments and heavy lifting. Patient neurovascularly intact and ambulatory. Sensation and reflexes intact. There is no tenderness to the cervical, thoracic, or lumbosacral midline. No bony deformities or step-offs palpated. No red flags or signs concerning for cauda equina. While patient with hx of benign paraganglioma, do not believe further work up with imaging is indicated at this time given timing of symptoms with recent strenuous activity. Have advised patient to follow up with orthopedics for further evaluation of symptoms and to manage pain with ice, ibuprofen, and robaxin. Indications for ED return discussed and patient agreeable to plan with no unaddressed concerns.  I personally performed the services described in this documentation, which was scribed in my presence. The recorded information has been reviewed and is accurate.     Antony Madura, PA-C 01/21/13 [phone]

## 2013-01-16 NOTE — ED Notes (Signed)
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

## 2013-01-21 NOTE — ED Provider Notes (Signed)
Medical screening examination/treatment/procedure(s) were performed by non-physician practitioner and as supervising physician I was immediately available for consultation/collaboration.   Enid Skeens, MD 01/21/13 [phone]

## 2013-01-23 ENCOUNTER — Emergency Department (HOSPITAL_COMMUNITY)
Admission: EM | Admit: 2013-01-23 | Discharge: 2013-01-23 | Disposition: A | Discharge status: Home / Self Care | Payer: Worker's Compensation | Attending: Emergency Medicine | Admitting: Emergency Medicine

## 2013-01-23 ENCOUNTER — Encounter (HOSPITAL_COMMUNITY): Payer: Self-pay | Admitting: *Deleted

## 2013-01-23 DIAGNOSIS — M545 Low back pain, unspecified: Secondary | ICD-10-CM | POA: Insufficient documentation

## 2013-01-23 DIAGNOSIS — G8928 Other chronic postprocedural pain: Secondary | ICD-10-CM | POA: Insufficient documentation

## 2013-01-23 MED ORDER — HYDROCODONE-ACETAMINOPHEN 5-325 MG PO TABS: 1.0000 | ORAL_TABLET | Freq: Once | ORAL | Status: DC

## 2013-01-23 MED ORDER — HYDROCODONE-ACETAMINOPHEN 5-325 MG PO TABS: ORAL_TABLET | ORAL | Status: DC

## 2013-01-23 MED ORDER — LEVOFLOXACIN 250 MG PO TABS: 750.0000 mg | ORAL_TABLET | Freq: Once | ORAL | Status: DC

## 2013-01-23 MED ORDER — CEFTRIAXONE SODIUM 1 G IJ SOLR: 1.0000 g | Freq: Once | INTRAMUSCULAR | Status: DC

## 2013-01-23 MED ORDER — ONDANSETRON 4 MG PO TBDP: 4.0000 mg | ORAL_TABLET | Freq: Once | ORAL | Status: DC

## 2013-01-23 NOTE — ED Provider Notes (Signed)
This chart was scribed for Wynetta Emery PA-C, a non-physician practitioner working with Glynn Octave, MD by Lewanda Rife, ED Scribe. This patient was seen in room TR08C/TR08C and the patient's care was started at 1919.    CSN: [id number]     Arrival date & time 01/23/13  1826 History   First MD Initiated Contact with Patient 01/23/13 1837     Chief Complaint  Patient presents with  . Back Pain   (Consider location/radiation/quality/duration/timing/severity/associated sxs/prior Treatment) The history is provided by the patient.   HPI Comments: JOHAAN RYSER is a 22 y.o. male who presents to the Emergency Department with h/o spine surgery to remove a benign paraganglioma complaining of unchanged constant left lower back pain radiating right lower back onset 2 months. Reports he works for [employer]. Describes pain as shooting and sharp. Denies associated numbness, weakness, fall, and injuries. Reports pain is aggravated with weight bearing and walking and alleviated by nothing. Spine surgery with Dr. Cyndra Numbers and Dr. Earna Coder in 2010.  History reviewed. No pertinent past medical history. Past Surgical History  Procedure Laterality Date  . Spine surgery     History reviewed. No pertinent family history. History  Substance Use Topics  . Smoking status: Never Smoker   . Smokeless tobacco: Not on file  . Alcohol Use: Yes    Review of Systems A complete 10 system review of systems was obtained and all systems are negative except as noted in the HPI and PMH.    Allergies  Review of patient's allergies indicates no known allergies.  Home Medications   Current Outpatient Rx  Name  Route  Sig  Dispense  Refill  . cyclobenzaprine (FLEXERIL) 5 MG tablet   Oral   Take 1 tablet (5 mg total) by mouth 3 (three) times daily as needed for muscle spasms.   30 tablet   0   . ibuprofen (ADVIL,MOTRIN) 800 MG tablet   Oral   Take 1 tablet (800 mg total) by mouth every 8 (eight) hours as  needed for pain.   30 tablet   0   . methocarbamol (ROBAXIN) 500 MG tablet   Oral   Take 1 tablet (500 mg total) by mouth 2 (two) times daily.   20 tablet   0    BP 133/76  Pulse 60  Temp(Src) 98.5 F (36.9 C) (Oral)  Resp 18  SpO2 98% Physical Exam  Nursing note and vitals reviewed. Constitutional: He is oriented to person, place, and time. He appears well-developed and well-nourished. No distress.  HENT:  Head: Normocephalic.  Mouth/Throat: Oropharynx is clear and moist.  Eyes: Conjunctivae and EOM are normal. Pupils are equal, round, and reactive to light.  Cardiovascular: Normal rate, regular rhythm and intact distal pulses.   Pulmonary/Chest: Effort normal and breath sounds normal. No stridor.  Abdominal: Soft. Bowel sounds are normal.  Musculoskeletal: Normal range of motion.  Neurological: He is alert and oriented to person, place, and time.  Strength is 5 out of 5 to bilateral lower extremities at hip and knee, extensor hallucis longus 5 out of 5. Ankle strength 5 out of 5, no clonus, neurovascularly intact.   Psychiatric: He has a normal mood and affect.    ED Course  Procedures (including critical care time) Medications - No data to display  Labs Review Labs Reviewed - No data to display Imaging Review No results found.  MDM  No diagnosis found. Filed Vitals:   01/23/13 1833 01/23/13 2011  BP: 133/76 131/72  Pulse: 60 55  Temp: 98.5 F (36.9 C) 97.8 F (36.6 C)  TempSrc: Oral Oral  Resp: 18 17  SpO2: 98% 100%     AQIL GOETTING is a 22 y.o. 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 However with his history think it is reasonable to refer him for a neurosurgical consult.   Pt is hemodynamically stable, appropriate for, and amenable to discharge at this time. Pt verbalized  understanding and agrees with care plan. All questions answered. Outpatient follow-up and specific return precautions discussed.    Discharge Medication List as of 01/23/2013  7:58 PM    START taking these medications   Details  HYDROcodone-acetaminophen (NORCO/VICODIN) 5-325 MG per tablet Take 1-2 tablets by mouth every 6 hours as needed for pain., Print        I personally performed the services described in this documentation, which was scribed in my presence. The recorded information has been reviewed and is accurate.  Note: Portions of this report may have been transcribed using voice recognition software. Every effort was made to ensure accuracy; however, inadvertent computerized transcription errors may be present    Wynetta Emery, PA-C 01/24/13 1704

## 2013-01-23 NOTE — ED Notes (Signed)
Pt was seen here last week for same, lower back pain. Was told to return if not improved. Pt ambulatory at triage, no acute distress noted.

## 2013-01-23 NOTE — ED Notes (Signed)
Pt states while working at his job Monday, began experiencing back pain. Came to ER on Tuesday and was told to follow up if no relief. Pt states he tried NSAIDS, ice and heating pads with no relief. R lower back pain with tightness with no radiating areas or other symptoms.

## 2013-01-25 NOTE — ED Provider Notes (Signed)
Medical screening examination/treatment/procedure(s) were performed by non-physician practitioner and as supervising physician I was immediately available for consultation/collaboration.   Jasman Pfeifle, MD 01/25/13 1219 

## 2013-09-13 ENCOUNTER — Emergency Department (HOSPITAL_COMMUNITY)
Admission: EM | Admit: 2013-09-13 | Discharge: 2013-09-13 | Disposition: A | Discharge status: Home / Self Care | Payer: BC Managed Care – PPO | Attending: Emergency Medicine | Admitting: Emergency Medicine

## 2013-09-13 ENCOUNTER — Encounter (HOSPITAL_COMMUNITY): Payer: Self-pay | Admitting: Emergency Medicine

## 2013-09-13 ENCOUNTER — Emergency Department (HOSPITAL_COMMUNITY): Payer: BC Managed Care – PPO

## 2013-09-13 DIAGNOSIS — J069 Acute upper respiratory infection, unspecified: Secondary | ICD-10-CM

## 2013-09-13 DIAGNOSIS — J45901 Unspecified asthma with (acute) exacerbation: Secondary | ICD-10-CM | POA: Insufficient documentation

## 2013-09-13 DIAGNOSIS — R1110 Vomiting, unspecified: Secondary | ICD-10-CM | POA: Insufficient documentation

## 2013-09-13 LAB — BASIC METABOLIC PANEL
BUN: 12 mg/dL (ref 6–23)
CALCIUM: 9.6 mg/dL (ref 8.4–10.5)
CO2: 23 mEq/L (ref 19–32)
Chloride: 99 mEq/L (ref 96–112)
Creatinine, Ser: 1.14 mg/dL (ref 0.50–1.35)
GFR, EST NON AFRICAN AMERICAN: 89 mL/min — AB (ref >90)
Glucose, Bld: 99 mg/dL (ref 70–99)
Potassium: 3.9 mEq/L (ref 3.7–5.3)
SODIUM: 138 meq/L (ref 137–147)

## 2013-09-13 LAB — CBC
HCT: 40.4 % (ref 39.0–52.0)
Hemoglobin: 14.3 g/dL (ref 13.0–17.0)
MCH: 25.7 pg — ABNORMAL LOW (ref 26.0–34.0)
MCHC: 35.4 g/dL (ref 30.0–36.0)
MCV: 72.7 fL — ABNORMAL LOW (ref 78.0–100.0)
PLATELETS: 250 10*3/uL (ref 150–400)
RBC: 5.56 MIL/uL (ref 4.22–5.81)
RDW: 14.2 % (ref 11.5–15.5)
WBC: 8 10*3/uL (ref 4.0–10.5)

## 2013-09-13 LAB — I-STAT TROPONIN, ED: TROPONIN I, POC: 0 ng/mL (ref 0.00–0.08)

## 2013-09-13 MED ORDER — ALBUTEROL SULFATE HFA 108 (90 BASE) MCG/ACT IN AERS
2.0000 | INHALATION_SPRAY | Freq: Once | RESPIRATORY_TRACT | Status: AC
  Administered 2013-09-13: 2 via RESPIRATORY_TRACT
  Filled 2013-09-13: qty 6.7

## 2013-09-13 MED ORDER — AEROCHAMBER PLUS W/MASK MISC
Status: AC
  Administered 2013-09-13: 14:00:00
  Filled 2013-09-13: qty 1

## 2013-09-13 NOTE — ED Notes (Signed)
Respiratory at bedside.

## 2013-09-13 NOTE — ED Notes (Signed)
Pt c/o mid sternal CP worse with inspiration; pt sts some SOB and wheezing; pt sts hx of asthma and c/o HA

## 2013-09-13 NOTE — ED Provider Notes (Signed)
CSN: [id number]     Arrival date & time 09/13/13  1151 History   First MD Initiated Contact with Patient 09/13/13 1255     Chief Complaint  Patient presents with  . Chest Pain  . Shortness of Breath     (Consider location/radiation/quality/duration/timing/severity/associated sxs/prior Treatment) Patient is a 23 y.o. male presenting with chest pain and shortness of breath. The history is provided by the patient and a parent. No language interpreter was used.  Chest Pain Pain location:  Substernal area Associated symptoms: cough and shortness of breath   Associated symptoms: no fever   Associated symptoms comment:  Chest tightness that started today associated with SOB and wheezing. No fever. He has a remote history of asthma without need for medications since childhood. He states that last night when symptoms started he felt as if food he was swallowing got stuck. He forced vomiting and states the pain persisted but improved. He has had water to drink since that time but no solids. He denies fever, nausea, spontaneous vomiting, nasal or sinus congestion or sore throat.  Shortness of Breath Associated symptoms: chest pain and cough   Associated symptoms: no fever and no sore throat     History reviewed. No pertinent past medical history. Past Surgical History  Procedure Laterality Date  . Spine surgery     History reviewed. No pertinent family history. History  Substance Use Topics  . Smoking status: Never Smoker   . Smokeless tobacco: Not on file  . Alcohol Use: Yes    Review of Systems  Constitutional: Negative for fever.  HENT: Negative for congestion and sore throat.   Respiratory: Positive for cough and shortness of breath.   Cardiovascular: Positive for chest pain.  Gastrointestinal:       See HPI.      Allergies  Peanut-containing drug products  Home Medications   Prior to Admission medications   Not on File   BP 126/75  Pulse 75  Temp(Src) 98.2 F (36.8  C) (Oral)  Resp 18  Ht 5\' 9"  (1.753 m)  Wt 172 lb (78.019 kg)  BMI 25.39 kg/m2  SpO2 99% Physical Exam  Constitutional: He is oriented to person, place, and time. He appears well-developed and well-nourished.  HENT:  Head: Normocephalic.  Neck: Normal range of motion. Neck supple.  Cardiovascular: Normal rate and regular rhythm.   Pulmonary/Chest: Effort normal and breath sounds normal. He has no wheezes. He has no rales.  Abdominal: Soft. Bowel sounds are normal. There is no tenderness. There is no rebound and no guarding.  Musculoskeletal: Normal range of motion. He exhibits no edema.  Neurological: He is alert and oriented to person, place, and time.  Skin: Skin is warm and dry. No rash noted.  Psychiatric: He has a normal mood and affect.    ED Course  Procedures (including critical care time) Labs Review Labs Reviewed  CBC - Abnormal; Notable for the following:    MCV 72.7 (*)    MCH 25.7 (*)    All other components within normal limits  BASIC METABOLIC PANEL - Abnormal; Notable for the following:    GFR calc non Af Amer 89 (*)    All other components within normal limits  I-STAT TROPOININ, ED   Results for orders placed during the hospital encounter of 09/13/13  CBC      Result Value Ref Range   WBC 8.0  4.0 - 10.5 K/uL   RBC 5.56  4.22 - 5.81 MIL/uL  Hemoglobin 14.3  13.0 - 17.0 g/dL   HCT 82.940.4  56.239.0 - 13.052.0 %   MCV 72.7 (*) 78.0 - 100.0 fL   MCH 25.7 (*) 26.0 - 34.0 pg   MCHC 35.4  30.0 - 36.0 g/dL   RDW 86.514.2  78.411.5 - 69.615.5 %   Platelets 250  150 - 400 K/uL  BASIC METABOLIC PANEL      Result Value Ref Range   Sodium 138  137 - 147 mEq/L   Potassium 3.9  3.7 - 5.3 mEq/L   Chloride 99  96 - 112 mEq/L   CO2 23  19 - 32 mEq/L   Glucose, Bld 99  70 - 99 mg/dL   BUN 12  6 - 23 mg/dL   Creatinine, Ser 2.951.14  0.50 - 1.35 mg/dL   Calcium 9.6  8.4 - 28.410.5 mg/dL   GFR calc non Af Amer 89 (*) >90 mL/min   GFR calc Af Amer >90  >90 mL/min  I-STAT TROPOININ, ED       Result Value Ref Range   Troponin i, poc 0.00  0.00 - 0.08 ng/mL   Comment 3            Dg Chest 2 View  09/13/2013   CLINICAL DATA:  Chest tightness  EXAM: CHEST - 2 VIEW  COMPARISON:  DG CHEST 2 VIEW dated 07/09/2008  FINDINGS: Normal heart size. Lungs are hyper aerated and clear. Postop changes in the right paraspinal region are stable. No pneumothorax. No pleural effusion.  IMPRESSION: No active cardiopulmonary disease.   Electronically Signed   By: Maryclare BeanArt  Hoss M.D.   On: 09/13/2013 12:55   Imaging Review Dg Chest 2 View  09/13/2013   CLINICAL DATA:  Chest tightness  EXAM: CHEST - 2 VIEW  COMPARISON:  DG CHEST 2 VIEW dated 07/09/2008  FINDINGS: Normal heart size. Lungs are hyper aerated and clear. Postop changes in the right paraspinal region are stable. No pneumothorax. No pleural effusion.  IMPRESSION: No active cardiopulmonary disease.   Electronically Signed   By: Maryclare BeanArt  Hoss M.D.   On: 09/13/2013 12:55     EKG Interpretation None      MDM   Final diagnoses:  None    1. URI  He feels improved with use of albuterol inhaler. He is eating and drinking in the room without pain or difficulty swallowing. VSS. Feel his symptoms are reactive in nature, likely allergy mediated given history of asthma and presentation today. Stable for discharge.     Arnoldo HookerShari A Ronica Vivian, PA-C 09/13/13 1502

## 2013-09-13 NOTE — Discharge Instructions (Signed)
Allergies °Allergies may happen from anything your body is sensitive to. This may be food, medicines, pollens, chemicals, and nearly anything around you in everyday life that produces allergens. An allergen is anything that causes an allergy producing substance. Heredity is often a factor in causing these problems. This means you may have some of the same allergies as your parents. °Food allergies happen in all age groups. Food allergies are some of the most severe and life threatening. Some common food allergies are cow's milk, seafood, eggs, nuts, wheat, and soybeans. °SYMPTOMS  °· Swelling around the mouth. °· An itchy red rash or hives. °· Vomiting or diarrhea. °· Difficulty breathing. °SEVERE ALLERGIC REACTIONS ARE LIFE-THREATENING. °This reaction is called anaphylaxis. It can cause the mouth and throat to swell and cause difficulty with breathing and swallowing. In severe reactions only a trace amount of food (for example, peanut oil in a salad) may cause death within seconds. °Seasonal allergies occur in all age groups. These are seasonal because they usually occur during the same season every year. They may be a reaction to molds, grass pollens, or tree pollens. Other causes of problems are house dust mite allergens, pet dander, and mold spores. The symptoms often consist of nasal congestion, a runny itchy nose associated with sneezing, and tearing itchy eyes. There is often an associated itching of the mouth and ears. The problems happen when you come in contact with pollens and other allergens. Allergens are the particles in the air that the body reacts to with an allergic reaction. This causes you to release allergic antibodies. Through a chain of events, these eventually cause you to release histamine into the blood stream. Although it is meant to be protective to the body, it is this release that causes your discomfort. This is why you were given anti-histamines to feel better.  If you are unable to  pinpoint the offending allergen, it may be determined by skin or blood testing. Allergies cannot be cured but can be controlled with medicine. °Hay fever is a collection of all or some of the seasonal allergy problems. It may often be treated with simple over-the-counter medicine such as diphenhydramine. Take medicine as directed. Do not drink alcohol or drive while taking this medicine. Check with your caregiver or package insert for child dosages. °If these medicines are not effective, there are many new medicines your caregiver can prescribe. Stronger medicine such as nasal spray, eye drops, and corticosteroids may be used if the first things you try do not work well. Other treatments such as immunotherapy or desensitizing injections can be used if all else fails. Follow up with your caregiver if problems continue. These seasonal allergies are usually not life threatening. They are generally more of a nuisance that can often be handled using medicine. °HOME CARE INSTRUCTIONS  °· If unsure what causes a reaction, keep a diary of foods eaten and symptoms that follow. Avoid foods that cause reactions. °· If hives or rash are present: °· Take medicine as directed. °· You may use an over-the-counter antihistamine (diphenhydramine) for hives and itching as needed. °· Apply cold compresses (cloths) to the skin or take baths in cool water. Avoid hot baths or showers. Heat will make a rash and itching worse. °· If you are severely allergic: °· Following a treatment for a severe reaction, hospitalization is often required for closer follow-up. °· Wear a medic-alert bracelet or necklace stating the allergy. °· You and your family must learn how to give adrenaline or use   an anaphylaxis kit. °· If you have had a severe reaction, always carry your anaphylaxis kit or EpiPen® with you. Use this medicine as directed by your caregiver if a severe reaction is occurring. Failure to do so could have a fatal outcome. °SEEK MEDICAL  CARE IF: °· You suspect a food allergy. Symptoms generally happen within 30 minutes of eating a food. °· Your symptoms have not gone away within 2 days or are getting worse. °· You develop new symptoms. °· You want to retest yourself or your child with a food or drink you think causes an allergic reaction. Never do this if an anaphylactic reaction to that food or drink has happened before. Only do this under the care of a caregiver. °SEEK IMMEDIATE MEDICAL CARE IF:  °· You have difficulty breathing, are wheezing, or have a tight feeling in your chest or throat. °· You have a swollen mouth, or you have hives, swelling, or itching all over your body. °· You have had a severe reaction that has responded to your anaphylaxis kit or an EpiPen®. These reactions may return when the medicine has worn off. These reactions should be considered life threatening. °MAKE SURE YOU:  °· Understand these instructions. °· Will watch your condition. °· Will get help right away if you are not doing well or get worse. °Document Released: 08/10/2002 Document Revised: 09/11/2012 Document Reviewed: 01/15/2008 °ExitCare® Patient Information ©2014 [employer]. ° °

## 2013-09-14 NOTE — ED Provider Notes (Signed)
Medical screening examination/treatment/procedure(s) were performed by non-physician practitioner and as supervising physician I was immediately available for consultation/collaboration.   EKG Interpretation None        Laray AngerKathleen M Camillia Marcy, DO 09/14/13 1558

## 2015-08-20 DIAGNOSIS — Z5900 Homelessness unspecified: Secondary | ICD-10-CM
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
## 2015-09-12 ENCOUNTER — Encounter (HOSPITAL_COMMUNITY): Payer: Self-pay

## 2015-09-12 ENCOUNTER — Emergency Department (HOSPITAL_COMMUNITY)
Admission: EM | Admit: 2015-09-12 | Discharge: 2015-09-12 | Disposition: A | Discharge status: Home / Self Care | Payer: 59 | Attending: Emergency Medicine | Admitting: Emergency Medicine

## 2015-09-12 DIAGNOSIS — J45901 Unspecified asthma with (acute) exacerbation: Secondary | ICD-10-CM | POA: Insufficient documentation

## 2015-09-12 DIAGNOSIS — R0682 Tachypnea, not elsewhere classified: Secondary | ICD-10-CM | POA: Diagnosis not present

## 2015-09-12 DIAGNOSIS — R000 Tachycardia, unspecified: Secondary | ICD-10-CM | POA: Diagnosis not present

## 2015-09-12 DIAGNOSIS — J45909 Unspecified asthma, uncomplicated: Secondary | ICD-10-CM | POA: Diagnosis present

## 2015-09-12 HISTORY — DX: Unspecified asthma, uncomplicated: J45.909

## 2015-09-12 MED ORDER — PREDNISONE 20 MG PO TABS
60.0000 mg | ORAL_TABLET | Freq: Once | ORAL | Status: AC
  Administered 2015-09-12: 60 mg via ORAL
  Filled 2015-09-12: qty 3

## 2015-09-12 MED ORDER — PREDNISONE 20 MG PO TABS: 60.0000 mg | ORAL_TABLET | Freq: Every day | ORAL | Status: AC

## 2015-09-12 MED ORDER — ALBUTEROL SULFATE (2.5 MG/3ML) 0.083% IN NEBU
INHALATION_SOLUTION | RESPIRATORY_TRACT | Status: AC
  Filled 2015-09-12: qty 6

## 2015-09-12 MED ORDER — ALBUTEROL SULFATE HFA 108 (90 BASE) MCG/ACT IN AERS
2.0000 | INHALATION_SPRAY | RESPIRATORY_TRACT | Status: AC
  Administered 2015-09-12: 2 via RESPIRATORY_TRACT
  Filled 2015-09-12: qty 6.7

## 2015-09-12 MED ORDER — IPRATROPIUM-ALBUTEROL 0.5-2.5 (3) MG/3ML IN SOLN
6.0000 mL | RESPIRATORY_TRACT | Status: DC
  Administered 2015-09-12: 6 mL via RESPIRATORY_TRACT
  Filled 2015-09-12: qty 3

## 2015-09-12 MED ORDER — ALBUTEROL SULFATE (2.5 MG/3ML) 0.083% IN NEBU
5.0000 mg | INHALATION_SOLUTION | Freq: Once | RESPIRATORY_TRACT | Status: AC
  Administered 2015-09-12: 5 mg via RESPIRATORY_TRACT

## 2015-09-12 NOTE — ED Notes (Signed)
Pt placed on 2L [place]

## 2015-09-12 NOTE — ED Notes (Signed)
Pt has hx of asthma. He reports today he had episode of chest tightness and shortness of breath. He works in Psychologist, occupationalthe lawn and garden at [employer] and episode started while he was at work. He used his inhaler with no relief.

## 2015-09-12 NOTE — Discharge Instructions (Signed)
Asthma Attack Prevention John Holloway, use your albuterol inhaler every 6 hours for the next 2 days, then use as needed.  Take steroids for the next 4 days as well.  See a primary care physician within 3 days for close follow up.  If symptoms worsen, come back to the ED immediately. Thank you. While you may not be able to control the fact that you have asthma, you can take actions to prevent asthma attacks. The best way to prevent asthma attacks is to maintain good control of your asthma. You can achieve this by:  Taking your medicines as directed.  Avoiding things that can irritate your airways or make your asthma symptoms worse (asthma triggers).  Keeping track of how well your asthma is controlled and of any changes in your symptoms.  Responding quickly to worsening asthma symptoms (asthma attack).  Seeking emergency care when it is needed. WHAT ARE SOME WAYS TO PREVENT AN ASTHMA ATTACK? Have a Plan Work with your health care provider to create a written plan for managing and treating your asthma attacks (asthma action plan). This plan includes:  A list of your asthma triggers and how you can avoid them.  Information on when medicines should be taken and when their dosages should be changed.  The use of a device that measures how well your lungs are working (peak flow meter). Monitor Your Asthma Use your peak flow meter and record your results in a journal every day. A drop in your peak flow numbers on one or more days may indicate the start of an asthma attack. This can happen even before you start to feel symptoms. You can prevent an asthma attack from getting worse by following the steps in your asthma action plan. Avoid Asthma Triggers Work with your asthma health care provider to find out what your asthma triggers are. This can be done by:  Allergy testing.  Keeping a journal that notes when asthma attacks occur and the factors that may have contributed to them.  Determining if  there are other medical conditions that are making your asthma worse. Once you have determined your asthma triggers, take steps to avoid them. This may include avoiding excessive or prolonged exposure to:  Dust. Have someone dust and vacuum your home for you once or twice a week. Using a high-efficiency particulate arrestance (HEPA) vacuum is best.  Smoke. This includes campfire smoke, forest fire smoke, and secondhand smoke from tobacco products.  Pet dander. Avoid contact with animals that you know you are allergic to.  Allergens from trees, grasses or pollens. Avoid spending a lot of time outdoors when pollen counts are high, and on very windy days.  Very cold, dry, or humid air.  Mold.  Foods that contain high amounts of sulfites.  Strong odors.  Outdoor air pollutants, such as Museum/gallery exhibitions officerengine exhaust.  Indoor air pollutants, such as aerosol sprays and fumes from household cleaners.  Household pests, including dust mites and cockroaches, and pest droppings.  Certain medicines, including NSAIDs. Always talk to your health care provider before stopping or starting any new medicines. Medicines Take over-the-counter and prescription medicines only as told by your health care provider. Many asthma attacks can be prevented by carefully following your medicine schedule. Taking your medicines correctly is especially important when you cannot avoid certain asthma triggers. Act Quickly If an asthma attack does happen, acting quickly can decrease how severe it is and how long it lasts. Take these steps:   Pay attention to your  symptoms. If you are coughing, wheezing, or having difficulty breathing, do not wait to see if your symptoms go away on their own. Follow your asthma action plan.  If you have followed your asthma action plan and your symptoms are not improving, call your health care provider or seek immediate medical care at the nearest hospital. It is important to note how often you need to  use your fast-acting rescue inhaler. If you are using your rescue inhaler more often, it may mean that your asthma is not under control. Adjusting your asthma treatment plan may help you to prevent future asthma attacks and help you to gain better control of your condition. HOW CAN I PREVENT AN ASTHMA ATTACK WHEN I EXERCISE? Follow advice from your health care provider about whether you should use your fast-acting inhaler before exercising. Many people with asthma experience exercise-induced bronchoconstriction (EIB). This condition often worsens during vigorous exercise in cold, humid, or dry environments. Usually, people with EIB can stay very active by pre-treating with a fast-acting inhaler before exercising.   This information is not intended to replace advice given to you by your health care provider. Make sure you discuss any questions you have with your health care provider.   Document Released: 05/05/2009 Document Revised: 02/05/2015 Document Reviewed: 10/17/2014 Elsevier Interactive Patient Education [employer]

## 2015-09-12 NOTE — ED Provider Notes (Signed)
CSN: [id number]     Arrival date & time 09/12/15  0011 History  By signing my name below, I, Arianna Nassar, attest that this documentation has been prepared under the direction and in the presence of Tomasita CrumbleAdeleke Venus Gilles, MD. Electronically Signed: Octavia HeirArianna Nassar, ED Scribe. 09/12/2015. 4:01 AM.     Chief Complaint  Patient presents with  . Asthma     The history is provided by the patient. No language interpreter was used.   HPI Comments: John Holloway is a 25 y.o. male who presents to the Emergency Department complaining of intermittent, gradual worsening, chest tightness and shortness of breath onset last week. Pt states he has been having minor asthma flare ups for the past week. Pt states that pollen usually triggers his asthma. Pt has an inhaler at home. Pt received a breathing treatment and was placed on 2L [place] of oxygen. Denies any other symptoms.   Past Medical History  Diagnosis Date  . Asthma    Past Surgical History  Procedure Laterality Date  . Spine surgery     No family history on file. Social History  Substance Use Topics  . Smoking status: Never Smoker   . Smokeless tobacco: None  . Alcohol Use: Yes    Review of Systems  A complete 10 system review of systems was obtained and all systems are negative except as noted in the HPI and PMH.    Allergies  Peanut-containing drug products  Home Medications   Prior to Admission medications   Not on File   Triage vitals: BP 124/92 mmHg  Pulse 106  Temp(Src) 98.6 F (37 C) (Oral)  Resp 16  SpO2 95% Physical Exam  Constitutional: He is oriented to person, place, and time. Vital signs are normal. He appears well-developed and well-nourished.  Non-toxic appearance. He does not appear ill. No distress.  HENT:  Head: Normocephalic and atraumatic.  Nose: Nose normal.  Mouth/Throat: Oropharynx is clear and moist. No oropharyngeal exudate.  Eyes: Conjunctivae and EOM are normal. Pupils are equal, round, and reactive to  light. No scleral icterus.  Neck: Normal range of motion. Neck supple. No tracheal deviation, no edema, no erythema and normal range of motion present. No thyroid mass and no thyromegaly present.  Cardiovascular: Regular rhythm, S1 normal, S2 normal, normal heart sounds, intact distal pulses and normal pulses.  Tachycardia present.  Exam reveals no gallop and no friction rub.   No murmur heard. Pulmonary/Chest: Effort normal and breath sounds normal. Tachypnea noted. No respiratory distress. He has no wheezes. He has no rhonchi. He has no rales.  Bilateral inspiratory and expiratory wheezes  Abdominal: Soft. Normal appearance and bowel sounds are normal. He exhibits no distension, no ascites and no mass. There is no hepatosplenomegaly. There is no tenderness. There is no rebound, no guarding and no CVA tenderness.  Musculoskeletal: Normal range of motion. He exhibits no edema or tenderness.  Lymphadenopathy:    He has no cervical adenopathy.  Neurological: He is alert and oriented to person, place, and time. He has normal strength. No cranial nerve deficit or sensory deficit.  Skin: Skin is warm, dry and intact. No petechiae and no rash noted. He is not diaphoretic. No erythema. No pallor.  Psychiatric: He has a normal mood and affect. His behavior is normal. Judgment normal.  Nursing note and vitals reviewed.   ED Course  Procedures  DIAGNOSTIC STUDIES: Oxygen Saturation is 95% on RA, normal by my interpretation.  COORDINATION OF CARE:  4:00  AM Discussed treatment plan with pt at bedside and pt agreed to plan.  Labs Review Labs Reviewed - No data to display  Imaging Review No results found. I have personally reviewed and evaluated these images and lab results as part of my medical decision-making.   EKG Interpretation   Date/Time:  Friday September 12 2015 00:29:22 EDT Ventricular Rate:  83 PR Interval:  168 QRS Duration: 94 QT Interval:  332 QTC Calculation: 390 R Axis:    89 Text Interpretation:  Normal sinus rhythm Normal ECG No significant change  since last tracing Confirmed by Erroll Luna [phone]) on 09/12/2015  3:56:12 AM      MDM   Final diagnoses:  None   Patient presents to the ED for asthma exacerbation.  He was initially hypoxic and wheezing per report.  My exam reveals wheezing, but his oxygenation is normal.  He was given an additional breathing treatment, along with prednisone.  Will DC with prednisone course and recommend for PCP fu within 3 days.    5:28 AM Upon repeat evaluation, wheezing has improved. Patient states he feels much better and at his baseline.  He demonstrates good understanding with the plan.  He appears well and in NAD.  VS remain within his normal limits and he is safe for DC.    I personally performed the services described in this documentation, which was scribed in my presence. The recorded information has been reviewed and is accurate.     Tomasita Crumble, MD 09/12/15 0530

## 2016-08-09 ENCOUNTER — Emergency Department (HOSPITAL_COMMUNITY)
Admission: EM | Admit: 2016-08-09 | Discharge: 2016-08-09 | Disposition: A | Discharge status: Home / Self Care | Payer: 59 | Attending: Emergency Medicine | Admitting: Emergency Medicine

## 2016-08-09 ENCOUNTER — Encounter (HOSPITAL_COMMUNITY): Payer: Self-pay | Admitting: *Deleted

## 2016-08-09 DIAGNOSIS — S50869A Insect bite (nonvenomous) of unspecified forearm, initial encounter: Secondary | ICD-10-CM

## 2016-08-09 DIAGNOSIS — Z79899 Other long term (current) drug therapy: Secondary | ICD-10-CM | POA: Insufficient documentation

## 2016-08-09 DIAGNOSIS — Z91010 Allergy to peanuts: Secondary | ICD-10-CM | POA: Insufficient documentation

## 2016-08-09 DIAGNOSIS — B353 Tinea pedis: Secondary | ICD-10-CM

## 2016-08-09 DIAGNOSIS — J45909 Unspecified asthma, uncomplicated: Secondary | ICD-10-CM | POA: Insufficient documentation

## 2016-08-09 DIAGNOSIS — W57XXXA Bitten or stung by nonvenomous insect and other nonvenomous arthropods, initial encounter: Secondary | ICD-10-CM | POA: Insufficient documentation

## 2016-08-09 DIAGNOSIS — S50862A Insect bite (nonvenomous) of left forearm, initial encounter: Secondary | ICD-10-CM | POA: Insufficient documentation

## 2016-08-09 DIAGNOSIS — Y939 Activity, unspecified: Secondary | ICD-10-CM | POA: Insufficient documentation

## 2016-08-09 DIAGNOSIS — Y929 Unspecified place or not applicable: Secondary | ICD-10-CM | POA: Insufficient documentation

## 2016-08-09 DIAGNOSIS — L089 Local infection of the skin and subcutaneous tissue, unspecified: Secondary | ICD-10-CM

## 2016-08-09 DIAGNOSIS — Y999 Unspecified external cause status: Secondary | ICD-10-CM | POA: Insufficient documentation

## 2016-08-09 MED ORDER — DOXYCYCLINE HYCLATE 100 MG PO CAPS: 100.0000 mg | ORAL_CAPSULE | Freq: Two times a day (BID) | ORAL | 0 refills | Status: AC

## 2016-08-09 MED ORDER — TERBINAFINE HCL 1 % EX CREA: 1.0000 "application " | TOPICAL_CREAM | Freq: Two times a day (BID) | CUTANEOUS | 0 refills | Status: AC

## 2016-08-09 NOTE — ED Triage Notes (Signed)
The pt has lt foot dryness and itching for awhile.  Abscess rt forearm for several days

## 2016-08-09 NOTE — ED Notes (Signed)
Pt has redness in between L foot 4th and 5th toe, and on L arm abscess with redness and swelling, see provider note for assessment

## 2016-08-09 NOTE — Discharge Instructions (Signed)
Take tylenol and ibuprofen as needed for pain. Return for worsening symptoms. Apply warm wet compresses to the area several times a day.

## 2016-08-09 NOTE — ED Provider Notes (Signed)
[facility]-EMERGENCY DEPT Provider Note   CSN: [id number] Arrival date & time: 08/09/16  2006  By signing my name below, I, Linna Darner, attest that this documentation has been prepared under the direction and in the presence of non-physician practitioner, [facility] M. Damian Leavell, NP. Electronically Signed: Linna Darner, Scribe. 08/09/2016. 8:38 PM.  History   Chief Complaint Chief Complaint  Patient presents with  . Foot Pain  . Abscess    The history is provided by the patient. No language interpreter was used.  Foot Pain  This is a new problem. The current episode started more than 1 week ago. The problem occurs constantly. The problem has not changed since onset.Pertinent negatives include no chest pain, no abdominal pain, no headaches and no shortness of breath. Nothing aggravates the symptoms. Nothing relieves the symptoms. Treatments tried: athlete's foot spray. The treatment provided no relief.  Abscess  Location:  Shoulder/arm Shoulder/arm abscess location:  L forearm Abscess quality: painful   Red streaking: no   Duration: several days. Progression:  Worsening Pain details:    Quality:  Unable to specify   Severity:  Moderate   Duration: several days.   Timing:  Constant   Progression:  Worsening Chronicity:  New Relieved by:  None tried Ineffective treatments:  None tried Associated symptoms: no fever, no headaches, no nausea and no vomiting   Risk factors: no prior abscess     HPI Comments: John Holloway is a 26 y.o. male who presents to the Emergency Department complaining of constant left foot pruritis for about 10 days. He notes the skin on his left foot has been "flaking". Pt used an OTC athlete's foot spray one time with no improvement of his foot symptoms and has not tried any other medications or treatments. Pt denies foot pain or swelling.  He is also complaining of a gradually worsening area of pain and swelling to his left forearm for several days. He believes he  may have been bitten by an insect at work but has not visualized any insects on his left arm. No medications or treatments tried for this complaint. He denies fever, chills, nausea, vomiting, abdominal pain, numbness/tingling, or any other associated symptoms.  Past Medical History:  Diagnosis Date  . Asthma     There are no active problems to display for this patient.   Past Surgical History:  Procedure Laterality Date  . SPINE SURGERY         Home Medications    Prior to Admission medications   Medication Sig Start Date End Date Taking? Authorizing Provider  albuterol (PROVENTIL HFA;VENTOLIN HFA) 108 (90 Base) MCG/ACT inhaler Inhale 2-3 puffs into the lungs every 6 (six) hours as needed for wheezing or shortness of breath.   Yes Historical Provider, MD  ibuprofen (ADVIL,MOTRIN) 200 MG tablet Take 400 mg by mouth every 6 (six) hours as needed (for pain).   Yes Historical Provider, MD  Multiple Vitamin (MULTIVITAMIN WITH MINERALS) TABS tablet Take 1 tablet by mouth daily.   Yes Historical Provider, MD  doxycycline (VIBRAMYCIN) 100 MG capsule Take 1 capsule (100 mg total) by mouth 2 (two) times daily. 08/09/16   Darrien Belter Orlene Och, NP  predniSONE (DELTASONE) 20 MG tablet Take 3 tablets (60 mg total) by mouth daily. Patient not taking: Reported on 08/09/2016 09/12/15   Tomasita Crumble, MD  terbinafine (LAMISIL AT) 1 % cream Apply 1 application topically 2 (two) times daily. 08/09/16   Mionna Advincula Orlene Och, NP    Family History No  family history on file.  Social History Social History  Substance Use Topics  . Smoking status: Never Smoker  . Smokeless tobacco: Never Used  . Alcohol use Yes     Allergies   Peanut-containing drug products   Review of Systems Review of Systems  Constitutional: Negative for chills and fever.  Respiratory: Negative for shortness of breath.   Cardiovascular: Negative for chest pain.  Gastrointestinal: Negative for abdominal pain, nausea and vomiting.    Musculoskeletal: Positive for myalgias (left forearm secondary to wound). Negative for joint swelling.  Skin: Positive for rash (left foot) and wound (left forearm).  Neurological: Negative for numbness and headaches.  All other systems reviewed and are negative.   Physical Exam Updated Vital Signs BP 142/97 (BP Location: Left Arm)   Pulse 78   Temp 98.4 F (36.9 C) (Oral)   Resp 14   Ht 5\' 10"  (1.778 m)   Wt 84.8 kg   SpO2 97%   BMI 26.83 kg/m   Physical Exam  Constitutional: He is oriented to person, place, and time. He appears well-developed and well-nourished. No distress.  HENT:  Head: Normocephalic and atraumatic.  Eyes: Conjunctivae and EOM are normal.  Neck: Neck supple.  Cardiovascular: Normal rate and regular rhythm.   Pulmonary/Chest: Effort normal and breath sounds normal.  Musculoskeletal: Normal range of motion.  Neurological: He is alert and oriented to person, place, and time.  Skin: Skin is warm and dry. Rash (left foot) noted.  Firm, tender area to the ulnar aspect of the left forearm that is raised with a pustular center.  Dry, cracking areas between the toes on the left that are consistent with fungus.  Psychiatric: He has a normal mood and affect. His behavior is normal.  Nursing note and vitals reviewed.   ED Treatments / Results  Labs (all labs ordered are listed, but only abnormal results are displayed) Labs Reviewed - No data to display  Radiology No results found.  Procedures Procedures (including critical care time)  DIAGNOSTIC STUDIES: Oxygen Saturation is 97% on RA, normal by my interpretation.    COORDINATION OF CARE: 8:45 PM Discussed treatment plan with pt at bedside and pt agreed to plan.  Medications Ordered in ED Medications - No data to display   Initial Impression / Assessment and Plan / ED Course  I have reviewed the triage vital signs and the nursing notes.  Final Clinical Impressions(s) / ED Diagnoses  26 y.o.  male with left foot itching and dryness between the toes and infected insect bite to the left arm stable for d/c without fever or red streaking. Will treat for fungus of foot and for infection of the insect bite of the left forearm. Discussed with the patient and all questioned fully answered. He will return if any problems arise.  Final diagnoses:  Infected insect bite of forearm, initial encounter  Tinea pedis of left foot    New Prescriptions Discharge Medication List as of 08/09/2016  8:47 PM    START taking these medications   Details  doxycycline (VIBRAMYCIN) 100 MG capsule Take 1 capsule (100 mg total) by mouth 2 (two) times daily., Starting Mon 08/09/2016, Print    terbinafine (LAMISIL AT) 1 % cream Apply 1 application topically 2 (two) times daily., Starting Mon 08/09/2016, Print       I personally performed the services described in this documentation, which was scribed in my presence. The recorded information has been reviewed and is accurate.    Alistar Mcenery  Orlene Och, NP 08/10/16 1610    Mancel Bale, MD 08/12/16 1100
